# Patient Record
Sex: Male | Born: 1995 | Race: White | Hispanic: No | Marital: Single
Health system: Southern US, Community
[De-identification: ages and names within clinical notes are randomized; demographics above are authoritative.]

---
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
## 2022-11-01 ENCOUNTER — Encounter: Payer: Self-pay | Admitting: Urology

## 2022-11-01 ENCOUNTER — Ambulatory Visit: Payer: BC Managed Care – PPO | Admitting: Urology

## 2022-11-01 VITALS — BP 136/80 | HR 56 | Ht 74.0 in | Wt 210.0 lb

## 2022-11-01 DIAGNOSIS — E291 Testicular hypofunction: Secondary | ICD-10-CM | POA: Diagnosis not present

## 2022-11-01 NOTE — Progress Notes (Signed)
    I, Maysun L Gibbs,acting as a scribe for Riki Altes, MD.,have documented all relevant documentation on the behalf of Riki Altes, MD,as directed by  Riki Altes, MD while in the presence of Riki Altes, MD.  11/01/2022 12:48 PM   Camelia Eng Tilden Dome 07/09/1995 [id number]  Referring provider: Ailene Ravel, MD [address] [place],  [place] [address]  Chief Complaint  Patient presents with   Other    Low testosterone     HPI: Benjamin Pineda is a 27 y.o. male here for evaluation of hypogonadism.  States over the past year he has had several illnesses and had his testosterone checked April 2024, which was low at 102 ng/dL. He thinks it was also checked by ENT and low, but he did not have these results. No bothersome tiredness/fatigue or decreased libido.  No history of fertility problems, though he is not actively trying conception.   Home Medications:  Allergies as of 11/01/2022   No Known Allergies      Medication List        Accurate as of November 01, 2022 12:48 PM. If you have any questions, ask your nurse or doctor.          acetaminophen 325 MG tablet Commonly known as: TYLENOL Take 650 mg by mouth every 6 (six) hours as needed for mild pain.   ibuprofen 600 MG tablet Commonly known as: ADVIL Take 1 tablet (600 mg total) by mouth every 6 (six) hours as needed.        Allergies: No Known Allergies   Physical Exam: BP 136/80   Pulse (!) 56   Ht 6\' 2"  (1.88 m)   Wt 210 lb (95.3 kg)   BMI 26.96 kg/m   Constitutional:  Alert and oriented, No acute distress. HEENT: [place] AT Respiratory: Normal respiratory effort, no increased work of breathing. GU: Phallus circumcised without lesions. Testes descended bilaterally without masses or tenderness. Estimated volume 15 cc bilaterally.  Psychiatric: Normal mood and affect.   Assessment & Plan:    1. Hypogonadism Recent level low at 102 ng/dL  Check LH and repeat testosterone level. If  no significant LH abnormalities, he is interested in a trial of Clomid.  I have reviewed the above documentation for accuracy and completeness, and I agree with the above.   Riki Altes, MD  [facility] [address], [address] [place], [place] [address] [phone]

## 2022-11-02 LAB — LUTEINIZING HORMONE: LH: 8.6 m[IU]/mL (ref 1.7–8.6)

## 2022-11-02 LAB — TESTOSTERONE: Testosterone: 716 ng/dL (ref 264–916)

## 2022-11-29 ENCOUNTER — Other Ambulatory Visit: Payer: BC Managed Care – PPO

## 2022-11-29 DIAGNOSIS — E291 Testicular hypofunction: Secondary | ICD-10-CM

## 2022-11-30 LAB — TESTOSTERONE: Testosterone: 598 ng/dL (ref 264–916)

## 2022-12-02 ENCOUNTER — Encounter: Payer: Self-pay | Admitting: *Deleted
